# Patient Record
Sex: Male | Born: 1990 | Race: White | Hispanic: No | Marital: Single | Smoking: Current every day smoker
Health system: Southern US, Community
[De-identification: ages and names within clinical notes are randomized; demographics above are authoritative.]

## PROBLEM LIST (undated history)

## (undated) HISTORY — PX: DENTAL SURGERY: SHX609

---
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
## 2016-05-09 ENCOUNTER — Emergency Department (HOSPITAL_COMMUNITY)
Admission: EM | Admit: 2016-05-09 | Discharge: 2016-05-10 | Disposition: A | Discharge status: Home / Self Care | Payer: BLUE CROSS/BLUE SHIELD | Attending: Emergency Medicine | Admitting: Emergency Medicine

## 2016-05-09 ENCOUNTER — Encounter (HOSPITAL_COMMUNITY): Payer: Self-pay

## 2016-05-09 ENCOUNTER — Emergency Department (HOSPITAL_COMMUNITY): Payer: BLUE CROSS/BLUE SHIELD

## 2016-05-09 DIAGNOSIS — M545 Low back pain: Secondary | ICD-10-CM | POA: Insufficient documentation

## 2016-05-09 DIAGNOSIS — Z79899 Other long term (current) drug therapy: Secondary | ICD-10-CM | POA: Diagnosis not present

## 2016-05-09 DIAGNOSIS — M25551 Pain in right hip: Secondary | ICD-10-CM | POA: Insufficient documentation

## 2016-05-09 DIAGNOSIS — J111 Influenza due to unidentified influenza virus with other respiratory manifestations: Secondary | ICD-10-CM | POA: Insufficient documentation

## 2016-05-09 DIAGNOSIS — R6889 Other general symptoms and signs: Secondary | ICD-10-CM

## 2016-05-09 DIAGNOSIS — R42 Dizziness and giddiness: Secondary | ICD-10-CM | POA: Diagnosis not present

## 2016-05-09 DIAGNOSIS — R531 Weakness: Secondary | ICD-10-CM | POA: Diagnosis not present

## 2016-05-09 DIAGNOSIS — F17200 Nicotine dependence, unspecified, uncomplicated: Secondary | ICD-10-CM | POA: Insufficient documentation

## 2016-05-09 DIAGNOSIS — R05 Cough: Secondary | ICD-10-CM | POA: Diagnosis present

## 2016-05-09 LAB — BASIC METABOLIC PANEL
ANION GAP: 10 (ref 5–15)
BUN: 14 mg/dL (ref 6–20)
CO2: 21 mmol/L — ABNORMAL LOW (ref 22–32)
Calcium: 9.9 mg/dL (ref 8.9–10.3)
Chloride: 108 mmol/L (ref 101–111)
Creatinine, Ser: 0.93 mg/dL (ref 0.61–1.24)
GFR calc Af Amer: >60 mL/min (ref >60)
GLUCOSE: 95 mg/dL (ref 65–99)
POTASSIUM: 4 mmol/L (ref 3.5–5.1)
Sodium: 139 mmol/L (ref 135–145)

## 2016-05-09 LAB — URINALYSIS, ROUTINE W REFLEX MICROSCOPIC
Glucose, UA: NEGATIVE mg/dL
Hgb urine dipstick: NEGATIVE
KETONES UR: 15 mg/dL — AB
LEUKOCYTES UA: NEGATIVE
NITRITE: NEGATIVE
PH: 7.5 (ref 5.0–8.0)
PROTEIN: 30 mg/dL — AB
Specific Gravity, Urine: 1.031 — ABNORMAL HIGH (ref 1.005–1.030)

## 2016-05-09 LAB — CBC WITH DIFFERENTIAL/PLATELET
BASOS ABS: 0 10*3/uL (ref 0.0–0.1)
Basophils Relative: 0 %
Eosinophils Absolute: 0 10*3/uL (ref 0.0–0.7)
Eosinophils Relative: 0 %
HEMATOCRIT: 40 % (ref 39.0–52.0)
Hemoglobin: 14 g/dL (ref 13.0–17.0)
LYMPHS PCT: 12 %
Lymphs Abs: 2.3 10*3/uL (ref 0.7–4.0)
MCH: 29.4 pg (ref 26.0–34.0)
MCHC: 35 g/dL (ref 30.0–36.0)
MCV: 84 fL (ref 78.0–100.0)
Monocytes Absolute: 0.9 10*3/uL (ref 0.1–1.0)
Monocytes Relative: 5 %
NEUTROS ABS: 16.4 10*3/uL — AB (ref 1.7–7.7)
NEUTROS PCT: 83 %
PLATELETS: 308 10*3/uL (ref 150–400)
RBC: 4.76 MIL/uL (ref 4.22–5.81)
RDW: 12.4 % (ref 11.5–15.5)
WBC: 19.7 10*3/uL — AB (ref 4.0–10.5)

## 2016-05-09 LAB — URINE MICROSCOPIC-ADD ON: RBC / HPF: NONE SEEN RBC/hpf (ref 0–5)

## 2016-05-09 LAB — RAPID STREP SCREEN (MED CTR MEBANE ONLY): Streptococcus, Group A Screen (Direct): NEGATIVE

## 2016-05-09 MED ORDER — ONDANSETRON HCL 4 MG/2ML IJ SOLN
4.0000 mg | Freq: Once | INTRAMUSCULAR | Status: AC
  Administered 2016-05-09: 4 mg via INTRAVENOUS
  Filled 2016-05-09: qty 2

## 2016-05-09 MED ORDER — SODIUM CHLORIDE 0.9 % IV BOLUS (SEPSIS)
1000.0000 mL | Freq: Once | INTRAVENOUS | Status: AC
  Administered 2016-05-09: 1000 mL via INTRAVENOUS

## 2016-05-09 MED ORDER — OSELTAMIVIR PHOSPHATE 75 MG PO CAPS: 75.0000 mg | ORAL_CAPSULE | Freq: Two times a day (BID) | ORAL | 0 refills | Status: DC

## 2016-05-09 MED ORDER — KETOROLAC TROMETHAMINE 30 MG/ML IJ SOLN
30.0000 mg | Freq: Once | INTRAMUSCULAR | Status: AC
  Administered 2016-05-09: 30 mg via INTRAVENOUS
  Filled 2016-05-09: qty 1

## 2016-05-09 NOTE — ED Notes (Signed)
Patient started feeling bad about noon. Patient states he was getting hot while in math class and having the shakes. The patient said that he felt bad. Patient tried to drive home and had to stop halfway there but finally made it home. Patient mother says her daughter found him blue in the floor and they took him to urgent care. Urgent care sent patient here. Patient states he hurts all over like he has been to the gym and worked out. Patient did not eat all day. Mother says he is a binge eater that eats a lot at night and do not eat all day.

## 2016-05-09 NOTE — ED Provider Notes (Signed)
[facility]-EMERGENCY DEPT Provider Note   CSN: [id number] Arrival date & time: 05/09/16  1510  By signing my name below, I, Vista Mink, attest that this documentation has been prepared under the direction and in the presence of Esten Dollar PA-C.  Electronically Signed: Vista Mink, ED Scribe. 05/09/16. 8:44 PM.   History   Chief Complaint Chief Complaint  Patient presents with  . Cough  . Generalized Body Aches    HPI HPI Comments: [name] is a 25 y.o. male who presents to the Emergency Department complaining of generalized discomfort, chills and diaphoresis onset 1300 today. Pt was in class when he started to have generalized discomfort and weakness all over his body and started to have chills. He describes it as "pain in all of my muscles". Later, he was driving home from school and started to have difficulty breathing and felt like his face was going numb, felt like he was going to pass out. Pt arrived home and sister found the pt in the bathroom "blue all over" and having difficulty breathing. He states that he was incredibly dizzy and thought the dizziness would subside if he laid on the floor. Family took him to an Urgent Care but was sent here to be evaluated. Pt states he did start to feel better in triage and his breathing started to improve. He currently reports a dull pain to his bilateral lower extremities that has been persistent since the incident occurred. He also reports worst pain to his right lower back today. He denies dysuria. Pt is a current smoker, 2-3 cigarettes per day.   The history is provided by the patient and a parent. No language interpreter was used.    History reviewed. No pertinent past medical history.  There are no active problems to display for this patient.   Past Surgical History:  Procedure Laterality Date  . DENTAL SURGERY         Home Medications    Prior to Admission medications   Medication Sig Start Date End Date Taking?  Authorizing Provider  [facility] OT Place 1 drop into the right ear every 4 (four) hours.   Yes Historical Provider, MD  oxymetazoline (AFRIN 12 HOUR) 0.05 % nasal spray Place 1 spray into both nostrils daily as needed for congestion.   Yes Historical Provider, MD  Pseudoeph-Doxylamine-DM-APAP (NYQUIL PO) Take 5 mLs by mouth at bedtime as needed (sleep/cold symptoms).   Yes Historical Provider, MD  Pseudoephedrine-APAP-DM (DAYQUIL PO) Take 2 capsules by mouth daily as needed (cold symptoms).   Yes Historical Provider, MD  oseltamivir (TAMIFLU) 75 MG capsule Take 1 capsule (75 mg total) by mouth every 12 (twelve) hours. 05/09/16   Marlon Pel, PA-C    Family History History reviewed. No pertinent family history.  Social History Social History  Substance Use Topics  . Smoking status: Current Every Day Smoker  . Smokeless tobacco: Never Used  . Alcohol use Yes     Comment: social     Allergies   Review of patient's allergies indicates no known allergies.   Review of Systems Review of Systems  Constitutional: Positive for chills and diaphoresis.  Genitourinary: Negative for dysuria.  Musculoskeletal: Positive for arthralgias ("all over") and back pain (above right posterior hip).  Neurological: Positive for dizziness and weakness ("all over").  All other systems reviewed and are negative.    Physical Exam Updated Vital Signs BP 109/79 (BP Location: Right Arm)   Pulse 65   Temp 98.3 F (36.8 C)  Resp (!) 7   Ht 5\' 6"  (1.676 m)   Wt 148 lb (67.1 kg)   SpO2 98%   BMI 23.89 kg/m   Physical Exam  Constitutional: He is oriented to person, place, and time. He appears well-developed and well-nourished. No distress.  HENT:  Head: Normocephalic and atraumatic.  Right Ear: External ear normal.  Left Ear: External ear normal.  Nose: Nose normal.  Mouth/Throat: Oropharynx is clear and moist. No oropharyngeal exudate.  Neck: Normal range of motion.    Cardiovascular: Normal rate, regular rhythm and normal heart sounds.   Pulmonary/Chest: Effort normal and breath sounds normal. He has no wheezes. He has no rales. He exhibits no tenderness.  Abdominal: Soft. Bowel sounds are normal. He exhibits no distension. There is no tenderness.  Musculoskeletal: He exhibits no tenderness.  Neurological: He is alert and oriented to person, place, and time.  Skin: Skin is warm and dry. He is not diaphoretic.  Psychiatric: He has a normal mood and affect. Judgment normal.  Nursing note and vitals reviewed.   ED Treatments / Results  DIAGNOSTIC STUDIES: Oxygen Saturation is 98% on RA, normal by my interpretation.  COORDINATION OF CARE: 8:37 PM-Will order blood work. Discussed treatment plan with pt at bedside and pt agreed to plan.   Labs (all labs ordered are listed, but only abnormal results are displayed) Labs Reviewed  CBC WITH DIFFERENTIAL/PLATELET - Abnormal; Notable for the following:       Result Value   WBC 19.7 (*)    Neutro Abs 16.4 (*)    All other components within normal limits  BASIC METABOLIC PANEL - Abnormal; Notable for the following:    CO2 21 (*)    All other components within normal limits  URINALYSIS, ROUTINE W REFLEX MICROSCOPIC (NOT AT [facility]) - Abnormal; Notable for the following:    Color, Urine AMBER (*)    APPearance CLOUDY (*)    Specific Gravity, Urine 1.031 (*)    Bilirubin Urine SMALL (*)    Ketones, ur 15 (*)    Protein, ur 30 (*)    All other components within normal limits  URINE MICROSCOPIC-ADD ON - Abnormal; Notable for the following:    Squamous Epithelial / LPF 0-5 (*)    Bacteria, UA RARE (*)    All other components within normal limits  RAPID STREP SCREEN (NOT AT [facility])  CULTURE, GROUP A STREP [facility])    EKG  EKG Interpretation None       Radiology Dg Chest 2 View  Result Date: 05/09/2016 CLINICAL DATA:  Cough and short of breath.  Chest pain EXAM: CHEST  2 VIEW COMPARISON:  None.  FINDINGS: The heart size and mediastinal contours are within normal limits. Both lungs are clear. The visualized skeletal structures are unremarkable. IMPRESSION: No active cardiopulmonary disease. Electronically Signed   By: Marlan Palauharles  Clark M.D.   On: 05/09/2016 15:49    Procedures Procedures (including critical care time)  Medications Ordered in ED Medications  sodium chloride 0.9 % bolus 1,000 mL (1,000 mLs Intravenous New Bag/Given 05/09/16 2111)  ketorolac (TORADOL) 30 MG/ML injection 30 mg (30 mg Intravenous Given 05/09/16 2112)  ondansetron (ZOFRAN) injection 4 mg (4 mg Intravenous Given 05/09/16 2112)    Initial Impression / Assessment and Plan / ED Course  I have reviewed the triage vital signs and the nursing notes.  Pertinent labs & imaging results that were available during my care of the patient were reviewed by me and considered in my  medical decision making (see chart for details).  Clinical Course    Patient with symptoms consistent with influenza.  Vitals are stable, low-grade fever.  No signs of dehydration, tolerating PO's.  Lungs are clear. Due to patient's presentation and physical exam a chest x-ray was normal.  Discussed the cost versus benefit of Tamiflu treatment with the patient.  The patient understands that symptoms are greater than the recommended 24-48 hour window of treatment.  Patient will be discharged with instructions to orally hydrate, rest, and use over-the-counter medications such as anti-inflammatories ibuprofen and Aleve for muscle aches and Tylenol for fever.  Pt also given rx for Tamiflu.   Final Clinical Impressions(s) / ED Diagnoses   Final diagnoses:  Flu-like symptoms    New Prescriptions New Prescriptions   OSELTAMIVIR (TAMIFLU) 75 MG CAPSULE    Take 1 capsule (75 mg total) by mouth every 12 (twelve) hours.  I personally performed the services described in this documentation, which was scribed in my presence. The recorded information has  been reviewed and is accurate.    Marlon Pel, PA-C 05/09/16 2330    Mancel Bale, MD 05/10/16 [phone]

## 2016-05-09 NOTE — ED Triage Notes (Signed)
Per EMS, pt from urgent care.  Pt was seen for cold like symptoms. Cough/fever/body aches x 1 week. Earache and using drops.  Vitals:  110/72, hr 90, resp 18, 100% ra

## 2016-05-12 LAB — CULTURE, GROUP A STREP (THRC)

## 2017-10-30 ENCOUNTER — Emergency Department (HOSPITAL_COMMUNITY)
Admission: EM | Admit: 2017-10-30 | Discharge: 2017-10-30 | Disposition: A | Discharge status: Home / Self Care | Payer: Commercial Managed Care - PPO | Attending: Emergency Medicine | Admitting: Emergency Medicine

## 2017-10-30 ENCOUNTER — Emergency Department (HOSPITAL_COMMUNITY): Payer: Commercial Managed Care - PPO

## 2017-10-30 ENCOUNTER — Encounter (HOSPITAL_COMMUNITY): Payer: Self-pay

## 2017-10-30 DIAGNOSIS — F17210 Nicotine dependence, cigarettes, uncomplicated: Secondary | ICD-10-CM | POA: Diagnosis not present

## 2017-10-30 DIAGNOSIS — R1011 Right upper quadrant pain: Secondary | ICD-10-CM | POA: Diagnosis present

## 2017-10-30 DIAGNOSIS — K529 Noninfective gastroenteritis and colitis, unspecified: Secondary | ICD-10-CM | POA: Diagnosis not present

## 2017-10-30 LAB — URINALYSIS, ROUTINE W REFLEX MICROSCOPIC
Bacteria, UA: NONE SEEN
Bilirubin Urine: NEGATIVE
Glucose, UA: NEGATIVE mg/dL
Hgb urine dipstick: NEGATIVE
Ketones, ur: 5 mg/dL — AB
LEUKOCYTES UA: NEGATIVE
Nitrite: NEGATIVE
Protein, ur: 30 mg/dL — AB
SPECIFIC GRAVITY, URINE: 1.02 (ref 1.005–1.030)
SQUAMOUS EPITHELIAL / LPF: NONE SEEN
pH: 6 (ref 5.0–8.0)

## 2017-10-30 LAB — CBC WITH DIFFERENTIAL/PLATELET
Basophils Absolute: 0 10*3/uL (ref 0.0–0.1)
Basophils Relative: 0 %
Eosinophils Absolute: 0 10*3/uL (ref 0.0–0.7)
Eosinophils Relative: 0 %
HCT: 44.8 % (ref 39.0–52.0)
HEMOGLOBIN: 15.6 g/dL (ref 13.0–17.0)
LYMPHS ABS: 0.4 10*3/uL — AB (ref 0.7–4.0)
LYMPHS PCT: 3 %
MCH: 29.8 pg (ref 26.0–34.0)
MCHC: 34.8 g/dL (ref 30.0–36.0)
MCV: 85.5 fL (ref 78.0–100.0)
Monocytes Absolute: 0.3 10*3/uL (ref 0.1–1.0)
Monocytes Relative: 2 %
NEUTROS ABS: 14.2 10*3/uL — AB (ref 1.7–7.7)
NEUTROS PCT: 95 %
Platelets: 277 10*3/uL (ref 150–400)
RBC: 5.24 MIL/uL (ref 4.22–5.81)
RDW: 12.3 % (ref 11.5–15.5)
WBC: 14.9 10*3/uL — AB (ref 4.0–10.5)

## 2017-10-30 LAB — I-STAT CHEM 8, ED
BUN: 17 mg/dL (ref 6–20)
CALCIUM ION: 1.14 mmol/L — AB (ref 1.15–1.40)
Chloride: 104 mmol/L (ref 101–111)
Creatinine, Ser: 0.9 mg/dL (ref 0.61–1.24)
GLUCOSE: 117 mg/dL — AB (ref 65–99)
HCT: 47 % (ref 39.0–52.0)
Hemoglobin: 16 g/dL (ref 13.0–17.0)
POTASSIUM: 3.4 mmol/L — AB (ref 3.5–5.1)
Sodium: 143 mmol/L (ref 135–145)
TCO2: 25 mmol/L (ref 22–32)

## 2017-10-30 LAB — COMPREHENSIVE METABOLIC PANEL
ALK PHOS: 58 U/L (ref 38–126)
ALT: 25 U/L (ref 17–63)
AST: 33 U/L (ref 15–41)
Albumin: 5.3 g/dL — ABNORMAL HIGH (ref 3.5–5.0)
Anion gap: 12 (ref 5–15)
BUN: 16 mg/dL (ref 6–20)
CALCIUM: 9.8 mg/dL (ref 8.9–10.3)
CO2: 23 mmol/L (ref 22–32)
CREATININE: 1.06 mg/dL (ref 0.61–1.24)
Chloride: 106 mmol/L (ref 101–111)
Glucose, Bld: 126 mg/dL — ABNORMAL HIGH (ref 65–99)
Potassium: 3.4 mmol/L — ABNORMAL LOW (ref 3.5–5.1)
Sodium: 141 mmol/L (ref 135–145)
Total Bilirubin: 1.2 mg/dL (ref 0.3–1.2)
Total Protein: 7.8 g/dL (ref 6.5–8.1)

## 2017-10-30 LAB — C DIFFICILE QUICK SCREEN W PCR REFLEX
C DIFFICILE (CDIFF) INTERP: NOT DETECTED
C DIFFICLE (CDIFF) ANTIGEN: NEGATIVE
C Diff toxin: NEGATIVE

## 2017-10-30 LAB — LIPASE, BLOOD: Lipase: 26 U/L (ref 11–51)

## 2017-10-30 MED ORDER — HYDROMORPHONE HCL 1 MG/ML IJ SOLN
0.5000 mg | Freq: Once | INTRAMUSCULAR | Status: AC
  Administered 2017-10-30: 0.5 mg via INTRAVENOUS
  Filled 2017-10-30: qty 1

## 2017-10-30 MED ORDER — HYDROCODONE-ACETAMINOPHEN 5-325 MG PO TABS: ORAL_TABLET | ORAL | 0 refills | Status: AC

## 2017-10-30 MED ORDER — SODIUM CHLORIDE 0.9 % IV BOLUS (SEPSIS)
1000.0000 mL | Freq: Once | INTRAVENOUS | Status: AC
  Administered 2017-10-30: 1000 mL via INTRAVENOUS

## 2017-10-30 MED ORDER — SODIUM CHLORIDE 0.9 % IJ SOLN
INTRAMUSCULAR | Status: AC
  Filled 2017-10-30: qty 50

## 2017-10-30 MED ORDER — IOPAMIDOL (ISOVUE-300) INJECTION 61%
100.0000 mL | Freq: Once | INTRAVENOUS | Status: AC | PRN
  Administered 2017-10-30: 100 mL via INTRAVENOUS

## 2017-10-30 MED ORDER — IOPAMIDOL (ISOVUE-300) INJECTION 61%
INTRAVENOUS | Status: AC
  Filled 2017-10-30: qty 100

## 2017-10-30 MED ORDER — CIPROFLOXACIN IN D5W 400 MG/200ML IV SOLN
400.0000 mg | Freq: Once | INTRAVENOUS | Status: AC
Start: 2017-10-30 — End: 2017-10-30
  Administered 2017-10-30: 400 mg via INTRAVENOUS
  Filled 2017-10-30: qty 200

## 2017-10-30 MED ORDER — DICYCLOMINE HCL 10 MG PO CAPS
10.0000 mg | ORAL_CAPSULE | Freq: Once | ORAL | Status: AC
Start: 2017-10-30 — End: 2017-10-30
  Administered 2017-10-30: 10 mg via ORAL
  Filled 2017-10-30: qty 1

## 2017-10-30 MED ORDER — METRONIDAZOLE 500 MG PO TABS: 500.0000 mg | ORAL_TABLET | Freq: Three times a day (TID) | ORAL | 0 refills | Status: AC

## 2017-10-30 MED ORDER — CIPROFLOXACIN HCL 500 MG PO TABS: 500.0000 mg | ORAL_TABLET | Freq: Two times a day (BID) | ORAL | 0 refills | Status: AC

## 2017-10-30 MED ORDER — METRONIDAZOLE IN NACL 5-0.79 MG/ML-% IV SOLN
500.0000 mg | Freq: Once | INTRAVENOUS | Status: AC
Start: 2017-10-30 — End: 2017-10-30
  Administered 2017-10-30: 500 mg via INTRAVENOUS
  Filled 2017-10-30: qty 100

## 2017-10-30 MED ORDER — DICYCLOMINE HCL 10 MG PO CAPS: 20.0000 mg | ORAL_CAPSULE | Freq: Four times a day (QID) | ORAL | 0 refills | Status: AC | PRN

## 2017-10-30 MED ORDER — ONDANSETRON HCL 4 MG/2ML IJ SOLN
4.0000 mg | Freq: Once | INTRAMUSCULAR | Status: AC
  Administered 2017-10-30: 4 mg via INTRAVENOUS
  Filled 2017-10-30: qty 2

## 2017-10-30 NOTE — ED Provider Notes (Signed)
[facility]-EMERGENCY DEPT Provider Note   CSN: [id number] Arrival date & time: 10/30/17  0016     History   Chief Complaint Chief Complaint  Patient presents with  . Flank Pain  . Emesis    HPI   Blood pressure 131/80, pulse (!) 124, temperature (!) 97.4 F (36.3 C), temperature source Oral, resp. rate 20, SpO2 100 %.  [name] is a 27 y.o. male complaining of acute onset of severe right upper quadrant pain several hours ago followed by multiple episodes of nonbloody but bilious emesis.  He denies any hematuria, history of kidney stones, fever, chills, change in bowel habits.  He has not had increasing postprandial pain in the past.  Pain is severe, no exacerbating or alleviating factors identified.  He denies cough, chest pain, shortness of breath.  History reviewed. No pertinent past medical history.  There are no active problems to display for this patient.   Past Surgical History:  Procedure Laterality Date  . DENTAL SURGERY         Home Medications    Prior to Admission medications   Medication Sig Start Date End Date Taking? Authorizing Provider  ibuprofen (ADVIL,MOTRIN) 200 MG tablet Take 400 mg by mouth every 6 (six) hours as needed for headache, mild pain or moderate pain.   Yes [provider]  ciprofloxacin (CIPRO) 500 MG tablet Take 1 tablet (500 mg total) by mouth 2 (two) times daily for 5 days. 10/30/17 11/04/17  Beatrix Breece, Joni Reining, PA-C  dicyclomine (BENTYL) 10 MG capsule Take 2 capsules (20 mg total) by mouth 4 (four) times daily as needed for spasms. 10/30/17   Hansford Hirt, Joni Reining, PA-C  HYDROcodone-acetaminophen (NORCO/VICODIN) 5-325 MG tablet Take 1-2 tablets by mouth every 6 hours as needed for pain. 10/30/17   Aveleen Nevers, Joni Reining, PA-C  metroNIDAZOLE (FLAGYL) 500 MG tablet Take 1 tablet (500 mg total) by mouth 3 (three) times daily for 5 days. 10/30/17 11/04/17  Chinyere Galiano, Mardella Layman    Family History History reviewed.  No pertinent family history.  Social History Social History   Tobacco Use  . Smoking status: Current Every Day Smoker  . Smokeless tobacco: Never Used  Substance Use Topics  . Alcohol use: Yes    Comment: social  . Drug use: Yes    Types: Marijuana     Allergies   Patient has no known allergies.   Review of Systems Review of Systems  A complete review of systems was obtained and all systems are negative except as noted in the HPI and PMH.   Physical Exam Updated Vital Signs BP 101/66 (BP Location: Right Arm)   Pulse 92   Temp 97.8 F (36.6 C) (Oral)   Resp 16   SpO2 98%   Physical Exam  Constitutional: He is oriented to person, place, and time. He appears well-developed and well-nourished. No distress.  HENT:  Head: Normocephalic and atraumatic.  Mouth/Throat: Oropharynx is clear and moist.  Eyes: Conjunctivae and EOM are normal. Pupils are equal, round, and reactive to light.  Neck: Normal range of motion.  Cardiovascular: Normal rate, regular rhythm and intact distal pulses.  Pulmonary/Chest: Effort normal and breath sounds normal. No stridor. No respiratory distress. He has no wheezes. He has no rales. He exhibits no tenderness.  Abdominal: Soft. There is tenderness.  Murphy sign positive.  No peritoneal signs  Genitourinary:  Genitourinary Comments: No CVA tenderness to percussion bilaterally  Musculoskeletal: Normal range of motion.  Neurological: He is alert and oriented  to person, place, and time.  Skin: He is not diaphoretic.  Psychiatric: He has a normal mood and affect.  Nursing note and vitals reviewed.    ED Treatments / Results  Labs (all labs ordered are listed, but only abnormal results are displayed) Labs Reviewed  URINALYSIS, ROUTINE W REFLEX MICROSCOPIC - Abnormal; Notable for the following components:      Result Value   APPearance HAZY (*)    Ketones, ur 5 (*)    Protein, ur 30 (*)    All other components within normal limits  CBC  WITH DIFFERENTIAL/PLATELET - Abnormal; Notable for the following components:   WBC 14.9 (*)    Neutro Abs 14.2 (*)    Lymphs Abs 0.4 (*)    All other components within normal limits  COMPREHENSIVE METABOLIC PANEL - Abnormal; Notable for the following components:   Potassium 3.4 (*)    Glucose, Bld 126 (*)    Albumin 5.3 (*)    All other components within normal limits  I-STAT CHEM 8, ED - Abnormal; Notable for the following components:   Potassium 3.4 (*)    Glucose, Bld 117 (*)    Calcium, Ion 1.14 (*)    All other components within normal limits  C DIFFICILE QUICK SCREEN W PCR REFLEX  GASTROINTESTINAL PANEL BY PCR, STOOL (REPLACES STOOL CULTURE)  LIPASE, BLOOD    EKG  EKG Interpretation None       Radiology Ct Abdomen Pelvis W Contrast  Result Date: 10/30/2017 CLINICAL DATA:  [age] year old male with right flank pain, nausea vomiting. EXAM: CT ABDOMEN AND PELVIS WITH CONTRAST TECHNIQUE: Multidetector CT imaging of the abdomen and pelvis was performed using the standard protocol following bolus administration of intravenous contrast. CONTRAST:  100mL ISOVUE-300 IOPAMIDOL (ISOVUE-300) INJECTION 61% COMPARISON:  Abdominal radiograph dated 09/03/2007 FINDINGS: Lower chest: The visualized lung bases are clear. No intra-abdominal free air or free fluid. Hepatobiliary: No focal liver abnormality is seen. No gallstones, gallbladder wall thickening, or biliary dilatation. Pancreas: Unremarkable. No pancreatic ductal dilatation or surrounding inflammatory changes. Spleen: Normal in size without focal abnormality. Adrenals/Urinary Tract: Adrenal glands are unremarkable. Kidneys are normal, without renal calculi, focal lesion, or hydronephrosis. Bladder is unremarkable. Stomach/Bowel: Multiple normal caliber fluid-filled loops of small bowel noted likely represent enteritis. Clinical correlation is recommended. Loose stool noted within the colon compatible with diarrheal state. There is no bowel  obstruction. The appendix is normal. Vascular/Lymphatic: No significant vascular findings are present. No enlarged abdominal or pelvic lymph nodes. Reproductive: The prostate and seminal vesicles are grossly unremarkable. Other: None Musculoskeletal: No acute or significant osseous findings. IMPRESSION: Diarrheal state with findings suggestive of enteritis. Clinical correlation is recommended. No bowel obstruction. Normal appendix. Electronically Signed   By: Elgie CollardArash  Radparvar M.D.   On: 10/30/2017 02:32    Procedures Procedures (including critical care time)  Medications Ordered in ED Medications  iopamidol (ISOVUE-300) 61 % injection (not administered)  sodium chloride 0.9 % injection (not administered)  metroNIDAZOLE (FLAGYL) IVPB 500 mg (500 mg Intravenous New Bag/Given 10/30/17 0500)  sodium chloride 0.9 % bolus 1,000 mL (0 mLs Intravenous Stopped 10/30/17 0211)  HYDROmorphone (DILAUDID) injection 0.5 mg (0.5 mg Intravenous Given 10/30/17 0141)  ondansetron (ZOFRAN) injection 4 mg (4 mg Intravenous Given 10/30/17 0141)  iopamidol (ISOVUE-300) 61 % injection 100 mL (100 mLs Intravenous Contrast Given 10/30/17 0217)  sodium chloride 0.9 % bolus 1,000 mL (0 mLs Intravenous Stopped 10/30/17 0459)  ciprofloxacin (CIPRO) IVPB 400 mg (0 mg Intravenous Stopped 10/30/17  0981)  HYDROmorphone (DILAUDID) injection 0.5 mg (0.5 mg Intravenous Given 10/30/17 0345)  dicyclomine (BENTYL) capsule 10 mg (10 mg Oral Given 10/30/17 0345)     Initial Impression / Assessment and Plan / ED Course  I have reviewed the triage vital signs and the nursing notes.  Pertinent labs & imaging results that were available during my care of the patient were reviewed by me and considered in my medical decision making (see chart for details).     Vitals:   10/30/17 0045 10/30/17 0243 10/30/17 0503  BP: 131/80 117/74 101/66  Pulse: (!) 124 94 92  Resp: 20 17 16   Temp: (!) 97.4 F (36.3 C) 97.8 F (36.6 C)   TempSrc: Oral  Oral   SpO2: 100% 100% 98%    Medications  iopamidol (ISOVUE-300) 61 % injection (not administered)  sodium chloride 0.9 % injection (not administered)  metroNIDAZOLE (FLAGYL) IVPB 500 mg (500 mg Intravenous New Bag/Given 10/30/17 0500)  sodium chloride 0.9 % bolus 1,000 mL (0 mLs Intravenous Stopped 10/30/17 0211)  HYDROmorphone (DILAUDID) injection 0.5 mg (0.5 mg Intravenous Given 10/30/17 0141)  ondansetron (ZOFRAN) injection 4 mg (4 mg Intravenous Given 10/30/17 0141)  iopamidol (ISOVUE-300) 61 % injection 100 mL (100 mLs Intravenous Contrast Given 10/30/17 0217)  sodium chloride 0.9 % bolus 1,000 mL (0 mLs Intravenous Stopped 10/30/17 0459)  ciprofloxacin (CIPRO) IVPB 400 mg (0 mg Intravenous Stopped 10/30/17 0459)  HYDROmorphone (DILAUDID) injection 0.5 mg (0.5 mg Intravenous Given 10/30/17 0345)  dicyclomine (BENTYL) capsule 10 mg (10 mg Oral Given 10/30/17 0345)    [name] is 27 y.o. male presenting with severe right upper quadrant pain with multiple episodes of emesis, no flank pain.  This was acute in onset.  He has a nonsurgical abdominal exam, tachycardic and appears to be in acute pain.  Blood work reassuring, tachycardia has resolved.  CT consistent with enteritis, patient has developed diarrhea while in the ED.  Given the severity of his pain will treat him for presumed bacterial cause of enteritis with Cipro and Flagyl.  Will test for C. difficile.  C. difficile testing negative, patient remains comfortable and vital signs normal.  Work note provided.  Extensive discussion of return precautions and patient verbalized understanding and teach back technique.   Evaluation does not show pathology that would require ongoing emergent intervention or inpatient treatment. Pt is hemodynamically stable and mentating appropriately. Discussed findings and plan with patient/guardian, who agrees with care plan. All questions answered. Return precautions discussed and outpatient follow up  given.      Final Clinical Impressions(s) / ED Diagnoses   Final diagnoses:  Enteritis    ED Discharge Orders        Ordered    dicyclomine (BENTYL) 10 MG capsule  4 times daily PRN     10/30/17 0532    ciprofloxacin (CIPRO) 500 MG tablet  2 times daily     10/30/17 0532    metroNIDAZOLE (FLAGYL) 500 MG tablet  3 times daily     10/30/17 0532    HYDROcodone-acetaminophen (NORCO/VICODIN) 5-325 MG tablet     10/30/17 0532       Nila Winker, Mardella Layman 10/30/17 0543    Molpus, Jonny Ruiz, MD 10/30/17 [phone]

## 2017-10-30 NOTE — ED Notes (Signed)
No respiratory or acute distress noted resting in bed with eyes closed call light in reach visitor at bedside no reaction to medication noted. 

## 2017-10-30 NOTE — Discharge Instructions (Signed)
Please follow with your primary care doctor in the next 2 days for a check-up. They must obtain records for further management.  ° °Do not hesitate to return to the Emergency Department for any new, worsening or concerning symptoms.  ° °

## 2017-10-30 NOTE — ED Notes (Signed)
Pt ambulated to the bathroom without assistance. 

## 2017-10-30 NOTE — ED Triage Notes (Signed)
Pt complains of right flank pain and vomiting since about 7pm

## 2017-10-31 LAB — MISC LABCORP TEST (SEND OUT): LABCORP TEST CODE: 183480

## 2019-03-26 IMAGING — CT CT ABD-PELV W/ CM
2 of 4 series · 16 of 46 positions shown, 18 images · IV contrast (ISOVUE)
Comparison: Abdominal radiograph dated 09/03/2007

CLINICAL DATA: 27-year-old male with right flank pain, nausea
vomiting.

EXAM:
CT ABDOMEN AND PELVIS WITH CONTRAST
TECHNIQUE: Multidetector CT imaging of the abdomen and pelvis was performed
using the standard protocol following bolus administration of
intravenous contrast.
CONTRAST:  100mL V485RS-Q33 IOPAMIDOL (V485RS-Q33) INJECTION 61%

[Series 2: axial st · axial · 0.66mm/px · z∈[-294,+81]mm · 13 of 83 slices shown, 15 images]
[im 4/83  soft-tissue]
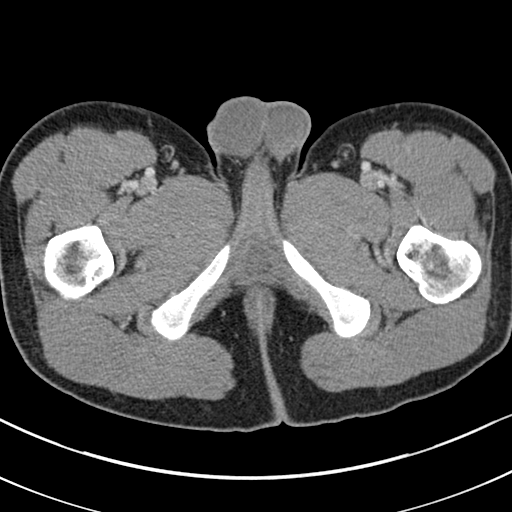
[im 4/83  bone]
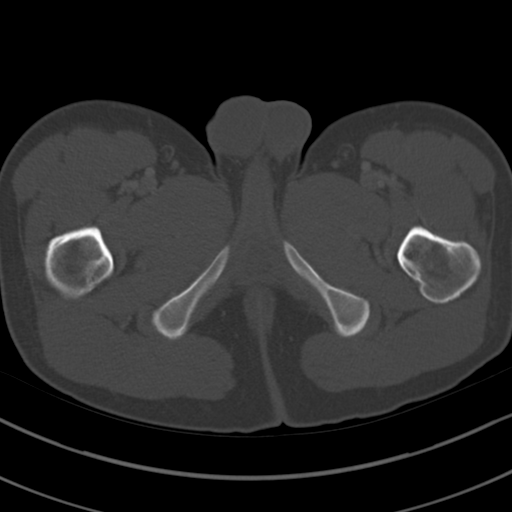
[im 12/83  soft-tissue]
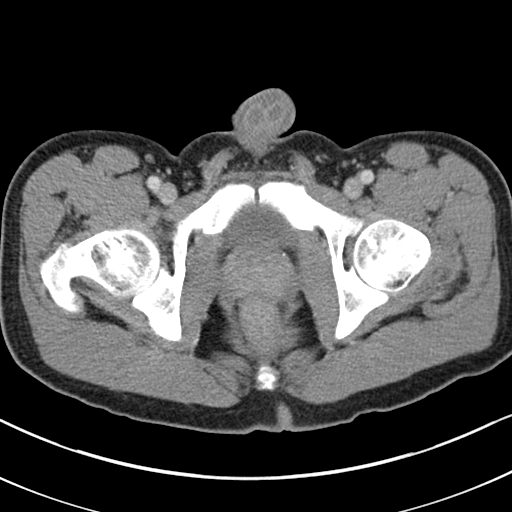
[im 16/83  soft-tissue]
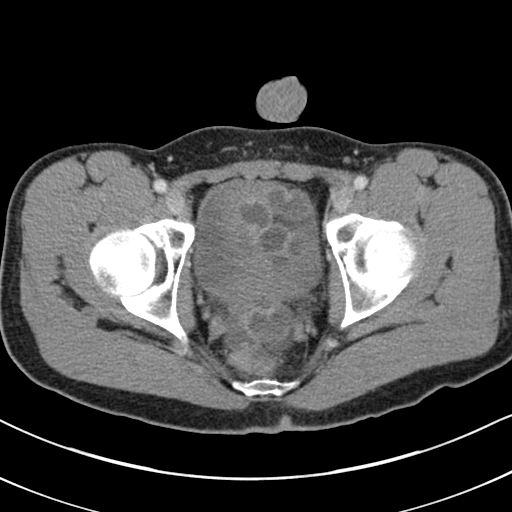
[im 24/83  soft-tissue]
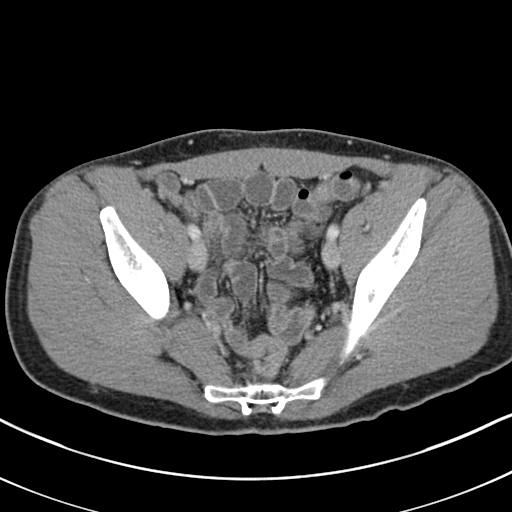
[im 28/83  soft-tissue]
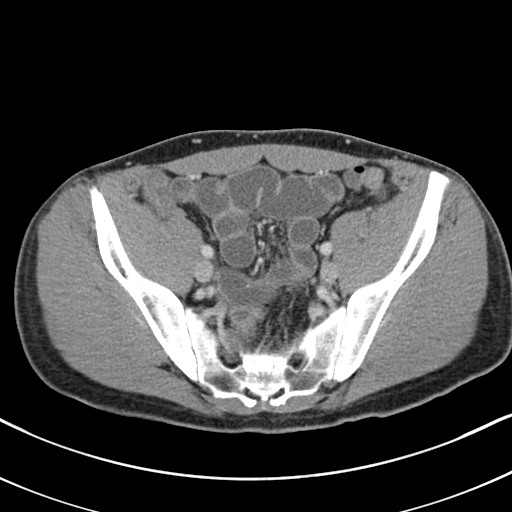
[im 36/83  soft-tissue]
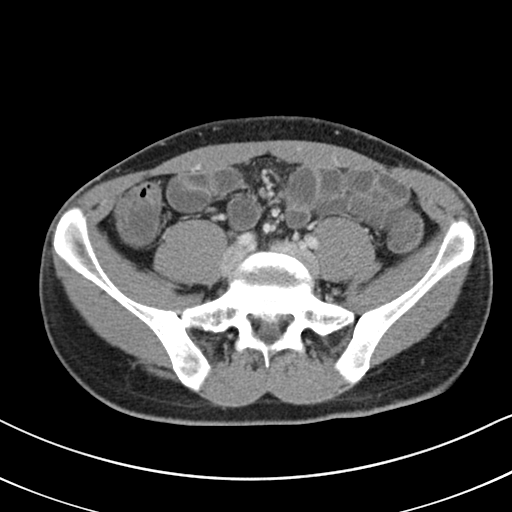
[im 43/83  soft-tissue]
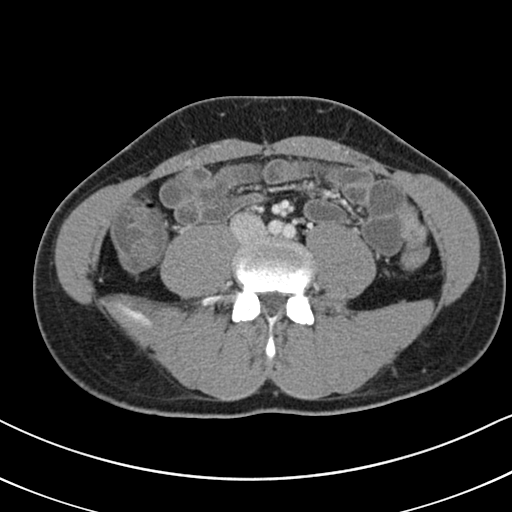
[im 47/83  soft-tissue]
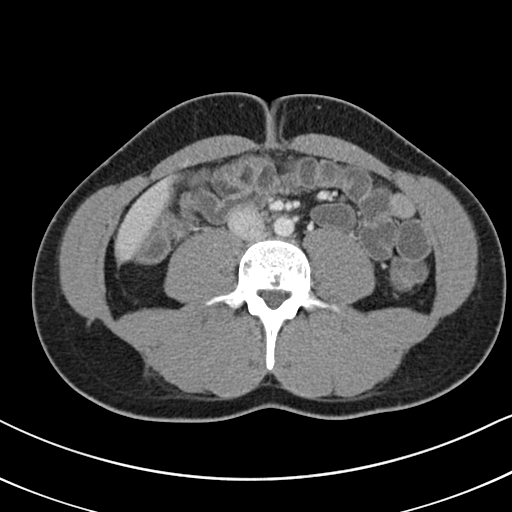
[im 55/83  soft-tissue]
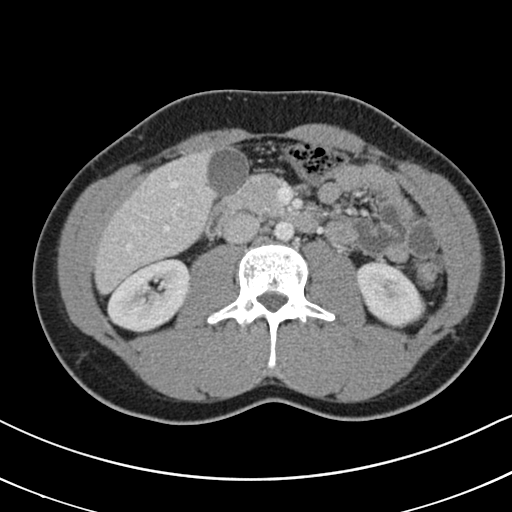
[im 55/83  bone]
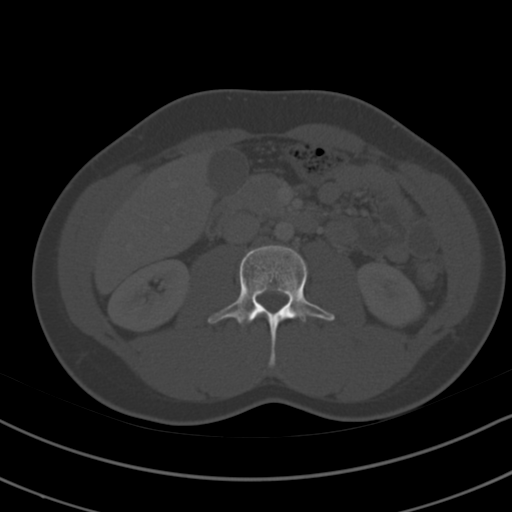
[im 59/83  soft-tissue]
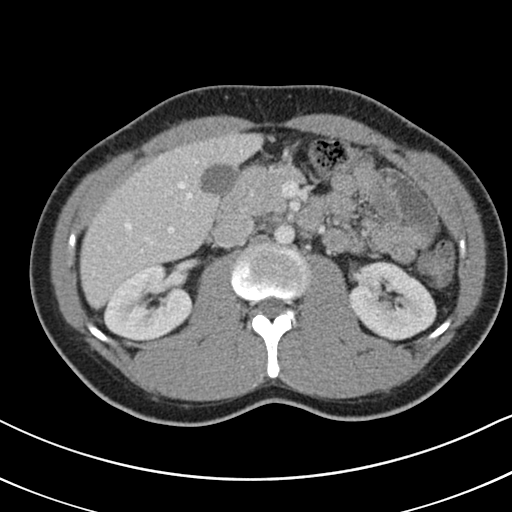
[im 67/83  soft-tissue]
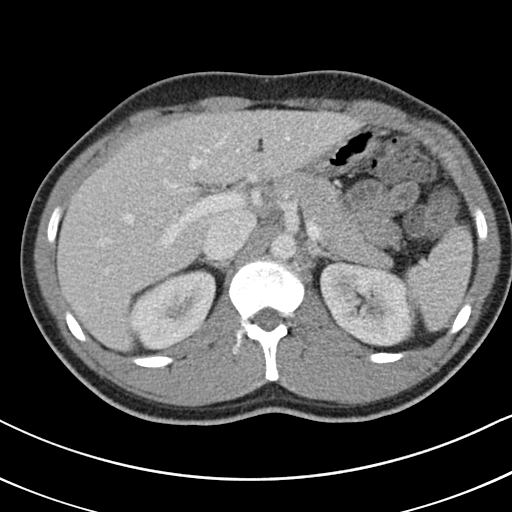
[im 71/83  soft-tissue]
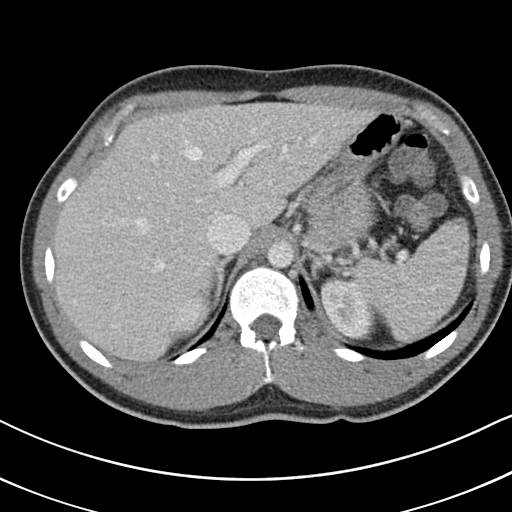
[im 79/83  soft-tissue]
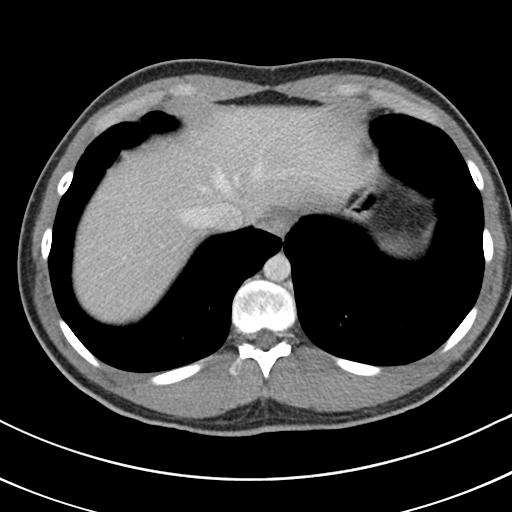

[Series 5: coronal st · coronal · 0.63mm/px · 3 of 72 slices shown]
[im 24/72  soft-tissue]
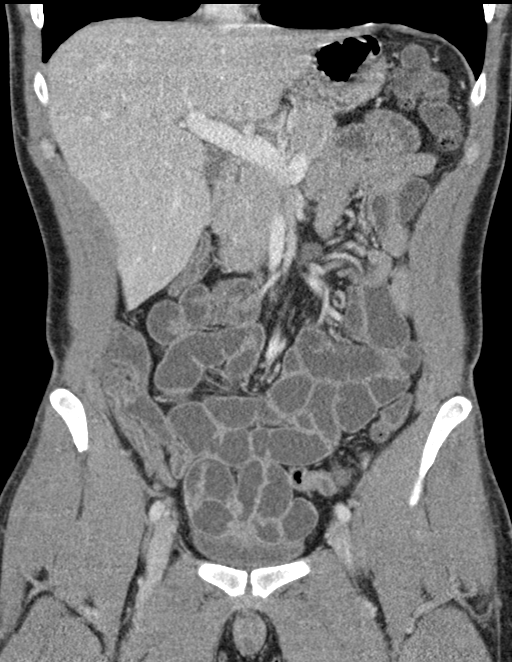
[im 32/72  soft-tissue]
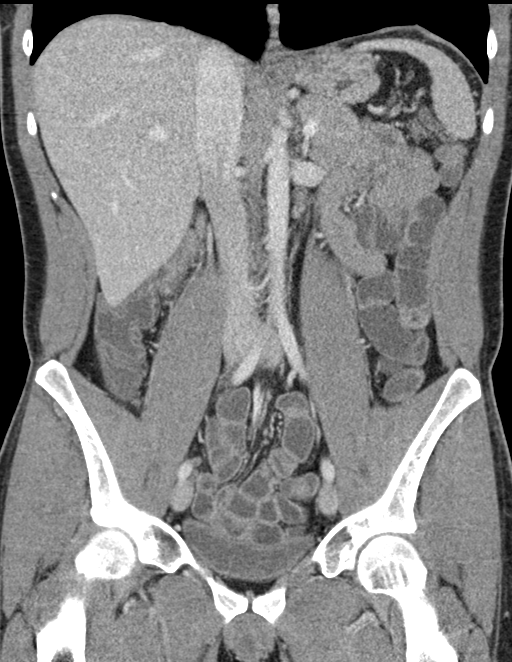
[im 40/72  soft-tissue]
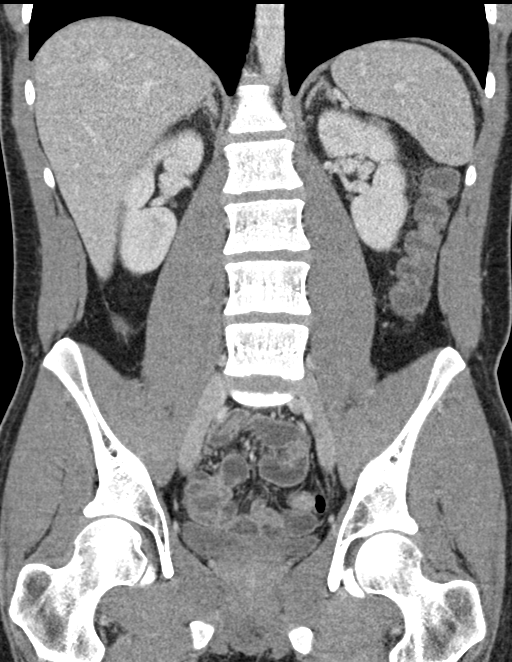

[16 of 46 positions shown; findings below may reference images not displayed]

FINDINGS: Lower chest: The visualized lung bases are clear.

No intra-abdominal free air or free fluid.

Hepatobiliary: No focal liver abnormality is seen. No gallstones,
gallbladder wall thickening, or biliary dilatation.

Pancreas: Unremarkable. No pancreatic ductal dilatation or
surrounding inflammatory changes.

Spleen: Normal in size without focal abnormality.

Adrenals/Urinary Tract: Adrenal glands are unremarkable. Kidneys are
normal, without renal calculi, focal lesion, or hydronephrosis.
Bladder is unremarkable.

Stomach/Bowel: Multiple normal caliber fluid-filled loops of small
bowel noted likely represent enteritis. Clinical correlation is
recommended. Loose stool noted within the colon compatible with
diarrheal state. There is no bowel obstruction. The appendix is
normal.

Vascular/Lymphatic: No significant vascular findings are present. No
enlarged abdominal or pelvic lymph nodes.

Reproductive: The prostate and seminal vesicles are grossly
unremarkable.

Other: None

Musculoskeletal: No acute or significant osseous findings.
IMPRESSION: Diarrheal state with findings suggestive of enteritis. Clinical
correlation is recommended. No bowel obstruction. Normal appendix.
